# Patient Record
Sex: Female | Born: 1975 | Race: White | Hispanic: No | Marital: Single | Smoking: Former smoker
Health system: Southern US, Community
[De-identification: ages and names within clinical notes are randomized; demographics above are authoritative.]

## PROBLEM LIST (undated history)

## (undated) DIAGNOSIS — F419 Anxiety disorder, unspecified: Secondary | ICD-10-CM

## (undated) DIAGNOSIS — R569 Unspecified convulsions: Secondary | ICD-10-CM

## (undated) HISTORY — PX: THYROID SURGERY: SHX805

---

## 2009-10-03 ENCOUNTER — Ambulatory Visit: Payer: Self-pay | Admitting: Diagnostic Radiology

## 2009-10-03 ENCOUNTER — Emergency Department (HOSPITAL_BASED_OUTPATIENT_CLINIC_OR_DEPARTMENT_OTHER): Admission: EM | Admit: 2009-10-03 | Discharge: 2009-10-03 | Payer: Self-pay | Admitting: Emergency Medicine

## 2009-10-08 ENCOUNTER — Ambulatory Visit: Payer: Self-pay | Admitting: Occupational Medicine

## 2009-10-17 ENCOUNTER — Emergency Department (HOSPITAL_BASED_OUTPATIENT_CLINIC_OR_DEPARTMENT_OTHER): Admission: EM | Admit: 2009-10-17 | Discharge: 2009-10-18 | Payer: Self-pay | Admitting: Emergency Medicine

## 2010-02-15 ENCOUNTER — Emergency Department (HOSPITAL_COMMUNITY): Admission: EM | Admit: 2010-02-15 | Discharge: 2010-02-15 | Payer: Self-pay | Admitting: Emergency Medicine

## 2010-10-15 NOTE — Assessment & Plan Note (Signed)
Summary: Fever, cough-green x 3dys rm 2   Vital Signs:  Patient Profile:   35 Years Old Female CC:      Cold & URI symptoms Height:     67 inches Weight:      126 pounds O2 Sat:      100 % O2 treatment:    Room Air Temp:     99.7 degrees F oral Pulse rate:   86 / minute Pulse rhythm:   regular Resp:     16 per minute BP sitting:   124 / 81  (right arm) Cuff size:   regular  Vitals Entered By: Areta Haber CMA (October 08, 2009 6:46 PM)                  Current Allergies: ! DARVOCET    History of Present Illness Chief Complaint: Cold & URI symptoms History of Present Illness: Presents with complaints of dry cough, sinus congestion and sore throat for 5 days.   Low grade fever.   Went to the ER several days ago.  Strep test was negative.  She has not been on any antibiotics.  Continues to complain of sinus pressure and congestion as well as fever and night sweats.  Cough at night is very bothersome.    Current Problems: UPPER RESPIRATORY INFECTION, ACUTE (ICD-465.9)   Current Meds AZITHROMYCIN 250 MG  TABS (AZITHROMYCIN) 2 by  mouth today and then 1 daily for 4 days CHERATUSSIN AC 100-10 MG/5ML SYRP (GUAIFENESIN-CODEINE) 5ml every 12 hours for cough  REVIEW OF SYSTEMS Constitutional Symptoms       Complains of fever and chills.     Denies night sweats, weight loss, weight gain, and fatigue.  Eyes       Denies change in vision, eye pain, eye discharge, glasses, contact lenses, and eye surgery. Ear/Nose/Throat/Mouth       Complains of frequent runny nose.      Denies hearing loss/aids, change in hearing, ear pain, ear discharge, dizziness, frequent nose bleeds, sinus problems, sore throat, hoarseness, and tooth pain or bleeding.  Respiratory       Complains of productive cough.      Denies dry cough, wheezing, shortness of breath, asthma, bronchitis, and emphysema/COPD.  Cardiovascular       Denies murmurs, chest pain, and tires easily with exhertion.     Gastrointestinal       Denies stomach pain, nausea/vomiting, diarrhea, constipation, blood in bowel movements, and indigestion. Genitourniary       Denies painful urination, kidney stones, and loss of urinary control. Neurological       Denies paralysis, seizures, and fainting/blackouts. Musculoskeletal       Denies muscle pain, joint pain, joint stiffness, decreased range of motion, redness, swelling, muscle weakness, and gout.  Skin       Denies bruising, unusual mles/lumps or sores, and hair/skin or nail changes.  Psych       Denies mood changes, temper/anger issues, anxiety/stress, speech problems, depression, and sleep problems. Other Comments: green x 3 dys.Pt was seen at [facility] ED Dx viral infection. Pt  does not have PCP.   Past History:  Past Medical History: Mitral Valve prolapse  Past Surgical History: Denies surgical history  Social History: Single Current Smoker - 1 pack a week Alcohol use-yes - 2 a week Drug use-no Regular exercise-no Smoking Status:  current Drug Use:  no Does Patient Exercise:  no Physical Exam General appearance: well developed, well nourished, no acute distress Ears:  normal, no lesions or deformities Nasal: swollen red turbinates with congestion Oral/Pharynx: tongue normal, posterior pharynx without erythema or exudate Neck: neck supple,  trachea midline, no masses Chest/Lungs: no rales, wheezes, or rhonchi bilateral, breath sounds equal without effort Heart: regular rate and  rhythm, no murmur Assessment New Problems: UPPER RESPIRATORY INFECTION, ACUTE (ICD-465.9)   Plan New Medications/Changes: CHERATUSSIN AC 100-10 MG/5ML SYRP (GUAIFENESIN-CODEINE) 5ml every 12 hours for cough  #157ml x 0, 10/08/2009, Kathrine Haddock MD AZITHROMYCIN 250 MG  TABS (AZITHROMYCIN) 2 by  mouth today and then 1 daily for 4 days  #6 x 0, 10/08/2009, Kathrine Haddock MD  New Orders: New Patient Level III [phone]  The patient and/or caregiver has been  counseled thoroughly with regard to medications prescribed including dosage, schedule, interactions, rationale for use, and possible side effects and they verbalize understanding.  Diagnoses and expected course of recovery discussed and will return if not improved as expected or if the condition worsens. Patient and/or caregiver verbalized understanding.  Prescriptions: CHERATUSSIN AC 100-10 MG/5ML SYRP (GUAIFENESIN-CODEINE) 5ml every 12 hours for cough  #147ml x 0   Entered and Authorized by:   Kathrine Haddock MD   Signed by:   Kathrine Haddock MD on 10/08/2009   Method used:   Print then Give to Patient   RxID:   [id number] AZITHROMYCIN 250 MG  TABS (AZITHROMYCIN) 2 by  mouth today and then 1 daily for 4 days  #6 x 0   Entered and Authorized by:   Kathrine Haddock MD   Signed by:   Kathrine Haddock MD on 10/08/2009   Method used:   Print then Give to Patient   RxID:   [id number]   Patient Instructions: 1)  Take your antibiotic as prescribed until ALL of it is gone, but stop if you develop a rash or swelling and contact our office as soon as possible. 2)  Acute bronchitis symptoms for less than 10 days are not helped by antibiotics. take over the counter cough medications. call if no improvment in  5-7 days, sooner if increasing cough, fever, or new symptoms( shortness of breath, chest pain).

## 2010-12-02 LAB — RAPID STREP SCREEN (MED CTR MEBANE ONLY): Streptococcus, Group A Screen (Direct): NEGATIVE

## 2010-12-05 LAB — URINALYSIS, ROUTINE W REFLEX MICROSCOPIC
Glucose, UA: NEGATIVE mg/dL
Urobilinogen, UA: 0.2 mg/dL (ref 0.0–1.0)
pH: 5.5 (ref 5.0–8.0)

## 2011-01-03 ENCOUNTER — Emergency Department (HOSPITAL_BASED_OUTPATIENT_CLINIC_OR_DEPARTMENT_OTHER)
Admission: EM | Admit: 2011-01-03 | Discharge: 2011-01-03 | Disposition: A | Discharge status: Home / Self Care | Payer: Medicaid Other | Attending: Emergency Medicine | Admitting: Emergency Medicine

## 2011-01-03 DIAGNOSIS — F17200 Nicotine dependence, unspecified, uncomplicated: Secondary | ICD-10-CM | POA: Insufficient documentation

## 2011-01-03 DIAGNOSIS — R002 Palpitations: Secondary | ICD-10-CM | POA: Insufficient documentation

## 2011-01-03 LAB — COMPREHENSIVE METABOLIC PANEL
BUN: 12 mg/dL (ref 6–23)
Chloride: 106 mEq/L (ref 96–112)
GFR calc non Af Amer: >60 mL/min (ref >60)
Glucose, Bld: 74 mg/dL (ref 70–99)
Potassium: 4 mEq/L (ref 3.5–5.1)
Total Bilirubin: 0.6 mg/dL (ref 0.3–1.2)

## 2011-01-03 LAB — DIFFERENTIAL
Basophils Absolute: 0 K/uL (ref 0.0–0.1)
Basophils Relative: 1 % (ref 0–1)
Eosinophils Absolute: 0.1 K/uL (ref 0.0–0.7)
Eosinophils Relative: 1 % (ref 0–5)
Lymphocytes Relative: 30 % (ref 12–46)
Lymphs Abs: 2.4 K/uL (ref 0.7–4.0)
Monocytes Absolute: 0.7 K/uL (ref 0.1–1.0)
Monocytes Relative: 8 % (ref 3–12)
Neutro Abs: 5 K/uL (ref 1.7–7.7)
Neutrophils Relative %: 61 % (ref 43–77)

## 2011-01-03 LAB — CBC
HCT: 36.6 % (ref 36.0–46.0)
MCH: 29.2 pg (ref 26.0–34.0)
MCHC: 33.9 g/dL (ref 30.0–36.0)
MCV: 86.1 fL (ref 78.0–100.0)
Platelets: 310 10*3/uL (ref 150–400)
RDW: 13 % (ref 11.5–15.5)

## 2011-01-03 LAB — POCT TOXICOLOGY PANEL

## 2011-02-10 ENCOUNTER — Other Ambulatory Visit (HOSPITAL_COMMUNITY)
Admission: RE | Admit: 2011-02-10 | Discharge: 2011-02-10 | Disposition: A | Discharge status: Home / Self Care | Payer: Medicaid Other | Source: Ambulatory Visit | Attending: Obstetrics and Gynecology | Admitting: Obstetrics and Gynecology

## 2011-02-10 ENCOUNTER — Other Ambulatory Visit: Payer: Self-pay | Admitting: Obstetrics and Gynecology

## 2011-02-10 ENCOUNTER — Emergency Department (HOSPITAL_BASED_OUTPATIENT_CLINIC_OR_DEPARTMENT_OTHER)
Admission: EM | Admit: 2011-02-10 | Discharge: 2011-02-10 | Disposition: A | Discharge status: Home / Self Care | Payer: Medicaid Other | Attending: Emergency Medicine | Admitting: Emergency Medicine

## 2011-02-10 DIAGNOSIS — F17200 Nicotine dependence, unspecified, uncomplicated: Secondary | ICD-10-CM | POA: Insufficient documentation

## 2011-02-10 DIAGNOSIS — E876 Hypokalemia: Secondary | ICD-10-CM | POA: Insufficient documentation

## 2011-02-10 DIAGNOSIS — R002 Palpitations: Secondary | ICD-10-CM | POA: Insufficient documentation

## 2011-02-10 DIAGNOSIS — Z01419 Encounter for gynecological examination (general) (routine) without abnormal findings: Secondary | ICD-10-CM | POA: Insufficient documentation

## 2011-02-10 DIAGNOSIS — R079 Chest pain, unspecified: Secondary | ICD-10-CM | POA: Insufficient documentation

## 2011-02-10 LAB — CK TOTAL AND CKMB (NOT AT ARMC)
Relative Index: 1.6 (ref 0.0–2.5)
Total CK: 147 U/L (ref 7–177)

## 2011-02-10 LAB — BASIC METABOLIC PANEL
BUN: 13 mg/dL (ref 6–23)
GFR calc Af Amer: >60 mL/min (ref >60)
GFR calc non Af Amer: >60 mL/min (ref >60)
Glucose, Bld: 89 mg/dL (ref 70–99)

## 2011-02-10 LAB — TROPONIN I: Troponin I: <0.3 ng/mL (ref <0.30)

## 2011-02-10 LAB — TSH: TSH: 3.019 u[IU]/mL (ref 0.350–4.500)

## 2011-03-20 ENCOUNTER — Ambulatory Visit: Payer: Medicaid Other | Admitting: Cardiovascular Disease

## 2011-04-14 ENCOUNTER — Emergency Department (HOSPITAL_BASED_OUTPATIENT_CLINIC_OR_DEPARTMENT_OTHER)
Admission: EM | Admit: 2011-04-14 | Discharge: 2011-04-15 | Disposition: A | Discharge status: Home / Self Care | Payer: Medicaid Other | Attending: Emergency Medicine | Admitting: Emergency Medicine

## 2011-04-14 ENCOUNTER — Encounter: Payer: Self-pay | Admitting: *Deleted

## 2011-04-14 DIAGNOSIS — J40 Bronchitis, not specified as acute or chronic: Secondary | ICD-10-CM

## 2011-04-14 DIAGNOSIS — Y9289 Other specified places as the place of occurrence of the external cause: Secondary | ICD-10-CM | POA: Insufficient documentation

## 2011-04-14 DIAGNOSIS — R51 Headache: Secondary | ICD-10-CM | POA: Insufficient documentation
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
## 2011-04-15 MED ORDER — HYDROCODONE-ACETAMINOPHEN 5-325 MG PO TABS
1.0000 | ORAL_TABLET | Freq: Once | ORAL | Status: AC
  Administered 2011-04-15: 1 via ORAL
  Filled 2011-04-15: qty 1

## 2011-04-15 MED ORDER — ALBUTEROL SULFATE HFA 108 (90 BASE) MCG/ACT IN AERS
2.0000 | INHALATION_SPRAY | RESPIRATORY_TRACT | Status: DC | PRN
  Administered 2011-04-15: 2 via RESPIRATORY_TRACT
  Filled 2011-04-15: qty 6.7

## 2011-04-15 MED ORDER — HYDROCODONE-ACETAMINOPHEN 5-325 MG PO TABS: 1.0000 | ORAL_TABLET | Freq: Four times a day (QID) | ORAL | Status: AC | PRN
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
## 2011-04-15 NOTE — Procedures (Signed)
Instructed pt on the proper use of administering albuteral mdi via aerochamber pt tolerated well 

## 2011-04-24 ENCOUNTER — Encounter (HOSPITAL_BASED_OUTPATIENT_CLINIC_OR_DEPARTMENT_OTHER): Payer: Self-pay | Admitting: *Deleted

## 2011-04-24 ENCOUNTER — Emergency Department (HOSPITAL_BASED_OUTPATIENT_CLINIC_OR_DEPARTMENT_OTHER)
Admission: EM | Admit: 2011-04-24 | Discharge: 2011-04-24 | Disposition: A | Discharge status: Home / Self Care | Payer: Medicaid Other | Attending: Emergency Medicine | Admitting: Emergency Medicine

## 2011-04-24 DIAGNOSIS — R05 Cough: Secondary | ICD-10-CM | POA: Insufficient documentation

## 2011-04-24 DIAGNOSIS — R059 Cough, unspecified: Secondary | ICD-10-CM | POA: Insufficient documentation

## 2011-04-24 DIAGNOSIS — J069 Acute upper respiratory infection, unspecified: Secondary | ICD-10-CM | POA: Insufficient documentation

## 2011-04-24 HISTORY — DX: Unspecified convulsions: R56.9

## 2011-04-24 MED ORDER — GUAIFENESIN-CODEINE 100-10 MG/5ML PO SYRP: 5.0000 mL | ORAL_SOLUTION | Freq: Three times a day (TID) | ORAL | Status: AC | PRN

## 2011-04-24 MED ORDER — AZITHROMYCIN 250 MG PO TABS: ORAL_TABLET | ORAL | Status: DC

## 2011-04-24 NOTE — ED Notes (Signed)
Pt c/o cough, chills, body aches and fatigue constant for 2 days but off and on for 2 weeks. Pt also sts she has been exposed recently to walking pneumonia.

## 2011-04-24 NOTE — ED Provider Notes (Signed)
History     CSN: [id number] Arrival date & time: 04/24/2011  6:09 AM  Chief Complaint  Patient presents with  . Cough   Patient is a 35 y.o. female presenting with cough. The history is provided by the patient.  Cough The current episode started more than 1 week ago. The cough is non-productive. There has been no fever. Associated symptoms include chills and sore throat. Pertinent negatives include no chest pain, no headaches and no shortness of breath.  patient has had a cough over the last 2 weeks. She has felt bad for that time. seh staes that she has hurt all over. Her nose was running before. It started with a sore throat, but that has resolved. She has fatigue. She states that she was exposed to walking pneumonia. She states that she feels like she has the flu, but she doesn't have the flu. No NVD. No dysuria. No rash.   Past Medical History  Diagnosis Date  . Seizures     History reviewed. No pertinent past surgical history.  No family history on file.  History  Substance Use Topics  . Smoking status: Current Everyday Smoker  . Smokeless tobacco: Not on file  . Alcohol Use: No    OB History    Grav Para Term Preterm Abortions TAB SAB Ect Mult Living                  Review of Systems  Constitutional: Positive for chills, activity change and fatigue. Negative for fever.  HENT: Positive for congestion and sore throat. Negative for mouth sores, neck pain and neck stiffness.   Respiratory: Positive for cough. Negative for shortness of breath.   Cardiovascular: Negative for chest pain.  Gastrointestinal: Negative for abdominal pain.  Genitourinary: Negative for dysuria.  Musculoskeletal: Positive for arthralgias. Negative for back pain.  Neurological: Negative for headaches.  Psychiatric/Behavioral: Negative for confusion.    Physical Exam  BP 108/61  Pulse 92  Temp(Src) 99.2 F (37.3 C) (Oral)  Resp 18  Ht 5\' 7"  (1.702 m)  Wt 125 lb (56.7 kg)  BMI 19.58 kg/m2   SpO2 100%  LMP 04/23/2011  Physical Exam  Nursing note and vitals reviewed. Constitutional: She is oriented to person, place, and time. She appears well-developed and well-nourished.  HENT:  Head: Normocephalic and atraumatic.  Eyes: EOM are normal. Pupils are equal, round, and reactive to light.  Neck: Normal range of motion.  Cardiovascular: Normal rate, regular rhythm and normal heart sounds.   No murmur heard. Pulmonary/Chest: Effort normal and breath sounds normal. No respiratory distress. She has no wheezes. She has no rales.  Abdominal: Soft. Bowel sounds are normal. She exhibits no distension. There is no rebound and no guarding.       Minimal lower abdominal tenderness without rebound or guarding.   Musculoskeletal: Normal range of motion.  Lymphadenopathy:    She has cervical adenopathy.  Neurological: She is alert and oriented to person, place, and time. No cranial nerve deficit.  Skin: Skin is warm and dry.  Psychiatric: Her speech is normal.    ED Course  Procedures  MDM URI symptoms for 2 weeks. Not improved, and reportedly has a pneumonia contact. Will treat with abx. Well appearing.       Juliet Rude. Rubin Payor, MD 04/24/11 [phone]

## 2011-11-08 ENCOUNTER — Other Ambulatory Visit: Payer: Self-pay

## 2011-11-08 ENCOUNTER — Encounter (HOSPITAL_COMMUNITY): Payer: Self-pay | Admitting: *Deleted

## 2011-11-08 ENCOUNTER — Emergency Department (HOSPITAL_COMMUNITY)
Admission: EM | Admit: 2011-11-08 | Discharge: 2011-11-09 | Disposition: A | Discharge status: Home / Self Care | Payer: Self-pay | Attending: Emergency Medicine | Admitting: Emergency Medicine

## 2011-11-08 DIAGNOSIS — F419 Anxiety disorder, unspecified: Secondary | ICD-10-CM

## 2011-11-08 DIAGNOSIS — F411 Generalized anxiety disorder: Secondary | ICD-10-CM | POA: Insufficient documentation

## 2011-11-08 DIAGNOSIS — R079 Chest pain, unspecified: Secondary | ICD-10-CM | POA: Insufficient documentation

## 2011-11-08 DIAGNOSIS — R569 Unspecified convulsions: Secondary | ICD-10-CM | POA: Insufficient documentation

## 2011-11-08 HISTORY — DX: Anxiety disorder, unspecified: F41.9

## 2011-11-08 LAB — CBC
Platelets: 276 10*3/uL (ref 150–400)
RBC: 4.11 MIL/uL (ref 3.87–5.11)
WBC: 7.2 10*3/uL (ref 4.0–10.5)

## 2011-11-08 LAB — BASIC METABOLIC PANEL
CO2: 26 mEq/L (ref 19–32)
Chloride: 102 mEq/L (ref 96–112)
Potassium: 3.6 mEq/L (ref 3.5–5.1)
Sodium: 139 mEq/L (ref 135–145)

## 2011-11-08 LAB — TROPONIN I: Troponin I: <0.3 ng/mL (ref <0.30)

## 2011-11-08 LAB — PRO B NATRIURETIC PEPTIDE: Pro B Natriuretic peptide (BNP): 69.2 pg/mL (ref 0–125)

## 2011-11-08 MED ORDER — NITROGLYCERIN 0.4 MG SL SUBL: 0.4000 mg | SUBLINGUAL_TABLET | SUBLINGUAL | Status: DC | PRN

## 2011-11-08 MED ORDER — ASPIRIN 325 MG PO TABS
325.0000 mg | ORAL_TABLET | ORAL | Status: AC
  Administered 2011-11-08: 325 mg via ORAL
  Filled 2011-11-08: qty 1

## 2011-11-08 NOTE — ED Notes (Signed)
Pt had an episode of CP that was in her mid chest and was preceded by a brief episode of palpitations with lightheadedness and sob.    Pt denies any cp at this time but has bilateral hand numbness and tingling.  Pt states that her dad died of cardiac arrest at 96

## 2011-11-08 NOTE — ED Notes (Signed)
Pt states that her CP lasted for a few seconds and felt like her heart fluttering. Pt states that she was sick a couple of days ago and she tried to do errands all day today even though she did not feel completely better. Pt states that her chest felt like pressure and then went away. Pt also states that her stomach hurt and that she felt slightly nauseated. Pt does admit to eating [employer] thsi afternoon

## 2011-11-09 ENCOUNTER — Emergency Department (HOSPITAL_COMMUNITY): Payer: Self-pay

## 2011-11-09 LAB — TROPONIN I: Troponin I: <0.3 ng/mL (ref <0.30)

## 2011-11-09 NOTE — ED Provider Notes (Signed)
History     CSN: [id number]  Arrival date & time 11/08/11  2253   First MD Initiated Contact with Patient 11/08/11 2335      Chief Complaint  Patient presents with  . Chest Pain    (Consider location/radiation/quality/duration/timing/severity/associated sxs/prior treatment) Patient is a 36 y.o. female presenting with chest pain. The history is provided by the patient. No language interpreter was used.  Chest Pain The chest pain began yesterday. Chest pain occurs frequently. The chest pain is unchanged. The pain is associated with breathing. At its most intense, the pain is at 9/10. The pain is currently at 0/10. The quality of the pain is described as sharp. The pain does not radiate. Exacerbated by: nothing. Primary symptoms include palpitations. Pertinent negatives for primary symptoms include no fever, no fatigue, no syncope, no shortness of breath, no cough, no wheezing, no abdominal pain, no nausea, no vomiting, no dizziness and no altered mental status. Primary symptoms comment: anxiety  The palpitations did not occur with dizziness or shortness of breath.  She tried nothing for the symptoms. Risk factors include smoking/tobacco exposure.  Her past medical history is significant for anxiety/panic attacks.  Pertinent negatives for past medical history include no aneurysm.  Her family medical history is significant for CAD in family.  Procedure history is negative for cardiac catheterization.     Past Medical History  Diagnosis Date  . Seizures   . Anxiety     History reviewed. No pertinent past surgical history.  No family history on file.  History  Substance Use Topics  . Smoking status: Current Everyday Smoker  . Smokeless tobacco: Not on file  . Alcohol Use: No    OB History    Grav Para Term Preterm Abortions TAB SAB Ect Mult Living                  Review of Systems  Constitutional: Negative for fever and fatigue.  HENT: Negative.   Eyes: Negative.     Respiratory: Negative for cough, shortness of breath and wheezing.   Cardiovascular: Positive for chest pain and palpitations. Negative for syncope.  Gastrointestinal: Negative.  Negative for nausea, vomiting and abdominal pain.  Genitourinary: Negative.   Musculoskeletal: Negative.   Neurological: Negative for dizziness.  Hematological: Negative.   Psychiatric/Behavioral: Negative.  Negative for altered mental status.    Allergies  Propoxyphene n-acetaminophen  Home Medications  No current outpatient prescriptions on file.  BP 138/73  Temp(Src) 98.5 F (36.9 C) (Oral)  Resp 20  Physical Exam  Constitutional: She is oriented to person, place, and time. She appears well-developed and well-nourished. No distress.  HENT:  Head: Normocephalic and atraumatic.  Mouth/Throat: Oropharynx is clear and moist.  Eyes: Conjunctivae are normal. Pupils are equal, round, and reactive to light.  Neck: Normal range of motion. Neck supple.  Cardiovascular: Normal rate and regular rhythm.   Pulmonary/Chest: Effort normal and breath sounds normal. She has no wheezes. She has no rales.  Abdominal: Soft. Bowel sounds are normal. There is no tenderness. There is no rebound.  Musculoskeletal: Normal range of motion. She exhibits no edema.  Neurological: She is alert and oriented to person, place, and time. She has normal reflexes.  Skin: Skin is warm and dry. She is not diaphoretic.  Psychiatric: Her mood appears anxious.    ED Course  Procedures (including critical care time)  Labs Reviewed  CBC - Abnormal; Notable for the following:    HCT 35.4 (*)  All other components within normal limits  BASIC METABOLIC PANEL  PRO B NATRIURETIC PEPTIDE  TROPONIN I  POCT PREGNANCY, URINE  TROPONIN I   Dg Chest 2 View  11/09/2011  *RADIOLOGY REPORT*  Clinical Data: Substernal chest pain.  CHEST - 2 VIEW  Comparison: 10/03/2009  Findings: Heart and mediastinal contours are within normal limits. No  focal opacities or effusions.  No acute bony abnormality.  IMPRESSION: Normal study.  Original Report Authenticated By: Cyndie Chime, M.D.     1. Anxiety       MDM   Date: 11/09/2011  Rate: 73  Rhythm: normal sinus rhythm  QRS Axis: normal  Intervals: normal  ST/T Wave abnormalities: normal  Conduction Disutrbances:none  Narrative Interpretation: borderline right axis  Old EKG Reviewed: unchanged    Return for worsening chest pain or shortness of breath follow up with your family doctor    Kayah Hecker Smitty Cords, MD 11/09/11 [phone]

## 2011-11-09 NOTE — Discharge Instructions (Signed)

## 2018-10-05 ENCOUNTER — Other Ambulatory Visit: Payer: Self-pay

## 2018-10-05 ENCOUNTER — Emergency Department (HOSPITAL_BASED_OUTPATIENT_CLINIC_OR_DEPARTMENT_OTHER): Payer: Medicaid Other

## 2018-10-05 ENCOUNTER — Emergency Department (HOSPITAL_BASED_OUTPATIENT_CLINIC_OR_DEPARTMENT_OTHER)
Admission: EM | Admit: 2018-10-05 | Discharge: 2018-10-05 | Disposition: A | Discharge status: Home / Self Care | Payer: Medicaid Other | Attending: Emergency Medicine | Admitting: Emergency Medicine

## 2018-10-05 ENCOUNTER — Encounter (HOSPITAL_BASED_OUTPATIENT_CLINIC_OR_DEPARTMENT_OTHER): Payer: Self-pay | Admitting: Emergency Medicine

## 2018-10-05 DIAGNOSIS — J069 Acute upper respiratory infection, unspecified: Secondary | ICD-10-CM | POA: Diagnosis not present

## 2018-10-05 DIAGNOSIS — F17210 Nicotine dependence, cigarettes, uncomplicated: Secondary | ICD-10-CM | POA: Insufficient documentation

## 2018-10-05 DIAGNOSIS — Z79899 Other long term (current) drug therapy: Secondary | ICD-10-CM | POA: Diagnosis not present

## 2018-10-05 DIAGNOSIS — R05 Cough: Secondary | ICD-10-CM | POA: Diagnosis present

## 2018-10-05 LAB — URINALYSIS, ROUTINE W REFLEX MICROSCOPIC
Bilirubin Urine: NEGATIVE
GLUCOSE, UA: NEGATIVE mg/dL
Ketones, ur: 15 mg/dL — AB
LEUKOCYTES UA: NEGATIVE
Nitrite: NEGATIVE
PH: 5.5 (ref 5.0–8.0)
PROTEIN: NEGATIVE mg/dL
Specific Gravity, Urine: >1.03 — ABNORMAL HIGH (ref 1.005–1.030)

## 2018-10-05 LAB — URINALYSIS, MICROSCOPIC (REFLEX)

## 2018-10-05 MED ORDER — ACETAMINOPHEN 500 MG PO TABS
1000.0000 mg | ORAL_TABLET | Freq: Once | ORAL | Status: AC
Start: 2018-10-05 — End: 2018-10-05
  Administered 2018-10-05: 1000 mg via ORAL
  Filled 2018-10-05: qty 2

## 2018-10-05 NOTE — ED Triage Notes (Signed)
Pt states she has breathing a lot of cleaning chemicals.  Yesterday started with sore throat, cough.  No acute respiratory distress.  Denies flu shot.

## 2018-10-05 NOTE — ED Provider Notes (Signed)
[facility] EMERGENCY DEPARTMENT Provider Note   CSN: [id number] Arrival date & time: 10/05/18  1456     History   Chief Complaint Chief Complaint  Patient presents with  . URI    HPI Madison Pierce is a 43 y.o. female.  The history is provided by the patient.  URI  Presenting symptoms: congestion, cough and fever   Presenting symptoms: no ear pain and no sore throat   Severity:  Moderate Onset quality:  Gradual Duration:  2 days Timing:  Intermittent Progression:  Waxing and waning Chronicity:  New Relieved by:  OTC medications Worsened by:  Nothing Associated symptoms: myalgias   Associated symptoms: no arthralgias and no wheezing   Risk factors: no sick contacts     Past Medical History:  Diagnosis Date  . Anxiety   . Seizures (HCC)     There are no active problems to display for this patient.   No past surgical history on file.   OB History   No obstetric history on file.      Home Medications    Prior to Admission medications   Medication Sig Start Date End Date Taking? Authorizing Provider  levothyroxine (SYNTHROID, LEVOTHROID) 25 MCG tablet daily. 04/16/17  Yes [provider]  Cholecalciferol (VITAMIN D) 50 MCG (2000 UT) tablet Take by mouth.    [provider]    Family History No family history on file.  Social History Social History   Tobacco Use  . Smoking status: Current Every Day Smoker  . Smokeless tobacco: Never Used  Substance Use Topics  . Alcohol use: No  . Drug use: No     Allergies   Propoxyphene n-acetaminophen   Review of Systems Review of Systems  Constitutional: Positive for fever. Negative for chills.  HENT: Positive for congestion. Negative for ear pain and sore throat.   Eyes: Negative for pain and visual disturbance.  Respiratory: Positive for cough. Negative for shortness of breath and wheezing.   Cardiovascular: Negative for chest pain and palpitations.  Gastrointestinal:  Negative for abdominal pain and vomiting.  Genitourinary: Negative for dysuria and hematuria.  Musculoskeletal: Positive for myalgias. Negative for arthralgias and back pain.  Skin: Negative for color change and rash.  Neurological: Negative for seizures and syncope.  All other systems reviewed and are negative.    Physical Exam Updated Vital Signs BP (!) 117/95 (BP Location: Right Arm)   Pulse (!) 111   Temp 98.9 F (37.2 C) (Oral)   Resp 16   Ht 5\' 6"  (1.676 m)   Wt 65.3 kg   LMP 09/14/2018   SpO2 99%   BMI 23.24 kg/m   Physical Exam Vitals signs and nursing note reviewed.  Constitutional:      General: She is not in acute distress.    Appearance: She is well-developed.  HENT:     Head: Normocephalic and atraumatic.     Right Ear: Tympanic membrane normal.     Left Ear: Tympanic membrane normal.     Nose: Congestion present.     Mouth/Throat:     Pharynx: No oropharyngeal exudate or posterior oropharyngeal erythema.  Eyes:     Extraocular Movements: Extraocular movements intact.     Conjunctiva/sclera: Conjunctivae normal.     Pupils: Pupils are equal, round, and reactive to light.  Neck:     Musculoskeletal: Normal range of motion and neck supple.  Cardiovascular:     Rate and Rhythm: Normal rate and regular rhythm.  Pulses: Normal pulses.     Heart sounds: Normal heart sounds. No murmur.  Pulmonary:     Effort: Pulmonary effort is normal. No respiratory distress.     Breath sounds: Normal breath sounds.  Abdominal:     General: Abdomen is flat.     Palpations: Abdomen is soft.     Tenderness: There is no abdominal tenderness.  Skin:    General: Skin is warm and dry.     Capillary Refill: Capillary refill takes less than 2 seconds.  Neurological:     General: No focal deficit present.     Mental Status: She is alert.  Psychiatric:        Mood and Affect: Mood normal.      ED Treatments / Results  Labs (all labs ordered are listed, but only  abnormal results are displayed) Labs Reviewed  URINALYSIS, ROUTINE W REFLEX MICROSCOPIC - Abnormal; Notable for the following components:      Result Value   Specific Gravity, Urine >1.030 (*)    Hgb urine dipstick TRACE (*)    Ketones, ur 15 (*)    All other components within normal limits  URINALYSIS, MICROSCOPIC (REFLEX) - Abnormal; Notable for the following components:   Bacteria, UA RARE (*)    All other components within normal limits    EKG None  Radiology Dg Chest 2 View  Result Date: 10/05/2018 CLINICAL DATA:  Cough and fever for several days EXAM: CHEST - 2 VIEW COMPARISON:  11/09/2011 FINDINGS: The heart size and mediastinal contours are within normal limits. Both lungs are clear. The visualized skeletal structures are unremarkable. IMPRESSION: No active cardiopulmonary disease. Electronically Signed   By: Alcide Clever M.D.   On: 10/05/2018 15:34    Procedures Procedures (including critical care time)  Medications Ordered in ED Medications  acetaminophen (TYLENOL) tablet 1,000 mg (1,000 mg Oral Given 10/05/18 1527)     Initial Impression / Assessment and Plan / ED Course  I have reviewed the triage vital signs and the nursing notes.  Pertinent labs & imaging results that were available during my care of the patient were reviewed by me and considered in my medical decision making (see chart for details).     Madison Pierce is a 43 year old female with no significant medical history who presents to the ED with cough, fever.  Patient with overall unremarkable vitals.  No fever.  Patient with symptoms for the last 2 days.  Has had some pain with urination.  Cough but no sputum production.  No signs of ear or throat infection on exam.  No abdominal tenderness.  Patient overall well-hydrated on exam.  Overall well-appearing.  Chest x-ray showed no signs of pneumonia, pneumothorax, pleural effusion.  Urinalysis showed no sign of infection.  Some mild ketones.  Recommend  increased oral hydration at home.  Recommend continued use of Tylenol and Motrin at home.  Patient given Tylenol while in the ED.  Had improvement.  Was able to tolerate p.o. without any issues.  Given return precautions and recommend follow-up with primary care doctor.  Patient likely with viral process.  Understands return precautions.  This chart was dictated using voice recognition software.  Despite best efforts to proofread,  errors can occur which can change the documentation meaning.   Final Clinical Impressions(s) / ED Diagnoses   Final diagnoses:  Upper respiratory tract infection, unspecified type    ED Discharge Orders    None       Tammie Ellsworth, DO  10/05/18 1559  

## 2018-10-05 NOTE — Discharge Instructions (Addendum)
There was no urinary tract infection or pneumonia on your lab work today.  Follow-up with your primary care provider symptoms do not improve.  Continue Tylenol, Motrin, hydration at home.

## 2019-12-08 ENCOUNTER — Other Ambulatory Visit: Payer: Self-pay

## 2019-12-08 ENCOUNTER — Emergency Department (HOSPITAL_BASED_OUTPATIENT_CLINIC_OR_DEPARTMENT_OTHER): Payer: Medicaid Other

## 2019-12-08 ENCOUNTER — Encounter (HOSPITAL_BASED_OUTPATIENT_CLINIC_OR_DEPARTMENT_OTHER): Payer: Self-pay

## 2019-12-08 ENCOUNTER — Emergency Department (HOSPITAL_BASED_OUTPATIENT_CLINIC_OR_DEPARTMENT_OTHER)
Admission: EM | Admit: 2019-12-08 | Discharge: 2019-12-08 | Disposition: A | Discharge status: Home / Self Care | Payer: Medicaid Other | Attending: Emergency Medicine | Admitting: Emergency Medicine

## 2019-12-08 DIAGNOSIS — G40909 Epilepsy, unspecified, not intractable, without status epilepticus: Secondary | ICD-10-CM | POA: Insufficient documentation

## 2019-12-08 DIAGNOSIS — Y9301 Activity, walking, marching and hiking: Secondary | ICD-10-CM | POA: Diagnosis not present

## 2019-12-08 DIAGNOSIS — Z87891 Personal history of nicotine dependence: Secondary | ICD-10-CM | POA: Insufficient documentation

## 2019-12-08 DIAGNOSIS — Y929 Unspecified place or not applicable: Secondary | ICD-10-CM | POA: Diagnosis not present

## 2019-12-08 DIAGNOSIS — W19XXXA Unspecified fall, initial encounter: Secondary | ICD-10-CM

## 2019-12-08 DIAGNOSIS — S93401A Sprain of unspecified ligament of right ankle, initial encounter: Secondary | ICD-10-CM | POA: Insufficient documentation

## 2019-12-08 DIAGNOSIS — Y999 Unspecified external cause status: Secondary | ICD-10-CM | POA: Diagnosis not present

## 2019-12-08 DIAGNOSIS — Z79899 Other long term (current) drug therapy: Secondary | ICD-10-CM | POA: Insufficient documentation

## 2019-12-08 DIAGNOSIS — S99911A Unspecified injury of right ankle, initial encounter: Secondary | ICD-10-CM | POA: Diagnosis present

## 2019-12-08 DIAGNOSIS — Z886 Allergy status to analgesic agent status: Secondary | ICD-10-CM | POA: Diagnosis not present

## 2019-12-08 DIAGNOSIS — H5712 Ocular pain, left eye: Secondary | ICD-10-CM

## 2019-12-08 DIAGNOSIS — W1842XA Slipping, tripping and stumbling without falling due to stepping into hole or opening, initial encounter: Secondary | ICD-10-CM | POA: Diagnosis not present

## 2019-12-08 MED ORDER — FLUORESCEIN SODIUM 1 MG OP STRP
1.0000 | ORAL_STRIP | Freq: Once | OPHTHALMIC | Status: AC
  Administered 2019-12-08: 1 via OPHTHALMIC
  Filled 2019-12-08: qty 1

## 2019-12-08 MED ORDER — TETRACAINE HCL 0.5 % OP SOLN
2.0000 [drp] | Freq: Once | OPHTHALMIC | Status: AC
  Administered 2019-12-08: 18:00:00 2 [drp] via OPHTHALMIC
  Filled 2019-12-08: qty 4

## 2019-12-08 MED ORDER — ERYTHROMYCIN 5 MG/GM OP OINT: TOPICAL_OINTMENT | OPHTHALMIC | 0 refills | Status: AC

## 2019-12-08 NOTE — ED Provider Notes (Signed)
[facility] EMERGENCY DEPARTMENT Provider Note   CSN: [id number] Arrival date & time: 12/08/19  1718     History Chief Complaint  Patient presents with  . Fall  . Conjunctivitis    Madison Pierce is a 44 y.o. female with past medical history of anxiety and seizures presenting to emergency department today with multiple chief complaints of left eye redness and right ankle pain. Patient was walking in her neighborhood when she accidentally stepped in a pothole causing her right ankle to invert.  She had sudden onset of pain in her ankle.  Pain radiates down her foot.  She has minimal pain at rest but with any movement pain worsens.  She rates pain 6 out of 10 in severity.  She tried to cut herself in the fall and has scrapes to bilateral palms and left elbow.  She not take any medications for pain prior to arrival.  She can bear weight on her right ankle but states it is very painful when trying to do so.  Denies hitting her head or passing out.  Patient has left eye redness x3 days.  She does not wear contacts.  When she first noticed the redness she thought it was a popped blood vessel.  She does admit to mild photophobia and itching sensation in her left eye.  She has not tried any over-the-counter treatments for her eye pain or redness.  Patient does wear glasses as needed, not wearing them currently.  Denies any blurry vision.  She tried to make an appointment with her eye doctor but was unable to.  She denies fever, chills, congestion, sore throat, neck pain, back pain, rash, numbness, tingling, weakness.  History provided by patient with additional history obtained from chart review.     Past Medical History:  Diagnosis Date  . Anxiety   . Seizures (HCC)     There are no problems to display for this patient.   Past Surgical History:  Procedure Laterality Date  . THYROID SURGERY       OB History   No obstetric history on file.     No family history on  file.  Social History   Tobacco Use  . Smoking status: Former Games developer  . Smokeless tobacco: Never Used  Substance Use Topics  . Alcohol use: Yes    Comment: occ  . Drug use: No    Home Medications Prior to Admission medications   Medication Sig Start Date End Date Taking? Authorizing Provider  levothyroxine (SYNTHROID, LEVOTHROID) 25 MCG tablet daily. 04/16/17  Yes [provider]  Cholecalciferol (VITAMIN D) 50 MCG (2000 UT) tablet Take by mouth.    [provider]  erythromycin ophthalmic ointment Place a 1/2 inch ribbon of ointment into the lower eyelid. 12/08/19   Taralee Marcus, Caroleen Hamman, PA-C    Allergies    Propoxyphene n-acetaminophen  Review of Systems   Review of Systems All other systems are reviewed and are negative for acute change except as noted in the HPI.  Physical Exam Updated Vital Signs BP 129/75 (BP Location: Left Arm)   Pulse 80   Temp 98.3 F (36.8 C) (Oral)   Resp 18   Ht 5\' 6"  (1.676 m)   Wt 61.2 kg   LMP 11/27/2019   SpO2 100%   BMI 21.79 kg/m   Physical Exam Vitals and nursing note reviewed.  Constitutional:      Appearance: She is well-developed. She is not ill-appearing or toxic-appearing.  HENT:  Head: Normocephalic and atraumatic.     Nose: Nose normal.  Eyes:     General: Lids are normal. Lids are everted, no foreign bodies appreciated. No scleral icterus.       Right eye: No discharge.        Left eye: No discharge.     Extraocular Movements: Extraocular movements intact.     Conjunctiva/sclera:     Left eye: Left conjunctiva is injected. No chemosis or exudate.    Pupils: Pupils are equal, round, and reactive to light.     Comments: Tetracaine and Fluorescein applied to both eyes.  Evaluation by Sherral Hammers lamp revealed no evidence of corneal abrasion/fluorescein uptake.  No dendritic lesions.  Negative Seidel sign.   Neck:     Vascular: No JVD.  Cardiovascular:     Rate and Rhythm: Normal rate and regular rhythm.      Pulses: Normal pulses.     Heart sounds: Normal heart sounds.  Pulmonary:     Effort: Pulmonary effort is normal.     Breath sounds: Normal breath sounds.  Abdominal:     General: There is no distension.  Musculoskeletal:        General: Normal range of motion.     Cervical back: Normal range of motion.     Comments: Superficial abrasions to bilateral palms.  No bleeding noted.  No tenderness to palpation of thoracic or lumbar spinous processes.  No step-off or deformity.  Full range of motion back.  Pelvis is stable. Full range of motion of bilateral knees.  There is swelling and tenderness over the lateral malleolus of right ankle.No overt deformity. No tenderness over the medial aspect of the ankle. The fifth metatarsal is not tender. The ankle joint is intact without excessive opening on stressing. No break in skin. Good pedal pulse and cap refill of all toes. Wiggling toes without difficulty.     Skin:    General: Skin is warm and dry.  Neurological:     Mental Status: She is oriented to person, place, and time.     GCS: GCS eye subscore is 4. GCS verbal subscore is 5. GCS motor subscore is 6.     Comments: Fluent speech, no facial droop.  Psychiatric:        Behavior: Behavior normal.     ED Results / Procedures / Treatments   Labs (all labs ordered are listed, but only abnormal results are displayed) Labs Reviewed - No data to display  EKG None  Radiology DG Ankle Complete Right  Result Date: 12/08/2019 CLINICAL DATA:  RIGHT ankle pain post fall today EXAM: RIGHT ANKLE - COMPLETE 3+ VIEW COMPARISON:  None FINDINGS: Minimal soft tissue swelling. Osseous mineralization normal. Joint spaces preserved. No acute fracture, dislocation, or bone destruction. IMPRESSION: No acute osseous abnormalities. Electronically Signed   By: Lavonia Dana M.D.   On: 12/08/2019 19:08   DG Foot Complete Right  Result Date: 12/08/2019 CLINICAL DATA:  RIGHT foot pain post fall today,  specially laterally EXAM: RIGHT FOOT COMPLETE - 3+ VIEW COMPARISON:  None FINDINGS: Osseous mineralization normal. Joint spaces preserved. No fracture, dislocation, or bone destruction. IMPRESSION: Normal exam. Electronically Signed   By: Lavonia Dana M.D.   On: 12/08/2019 19:15    Procedures Procedures (including critical care time)  Medications Ordered in ED Medications  fluorescein ophthalmic strip 1 strip (1 strip Both Eyes Given by Other 12/08/19 1821)  tetracaine (PONTOCAINE) 0.5 % ophthalmic solution 2 drop (2 drops Both  Eyes Given by Other 12/08/19 1820)    ED Course  I have reviewed the triage vital signs and the nursing notes.  Pertinent labs & imaging results that were available during my care of the patient were reviewed by me and considered in my medical decision making (see chart for details).    MDM Rules/Calculators/A&P                      Patient seen and examined. Patient presents awake, alert, hemodynamically stable, afebrile, non toxic.  She is presenting to the ED with complaints of pain to the right ankle s/p injury mechanical fall. Exam without obvious deformity or open wounds. ROM intact. Tender to palpation over lateral malleolus of right ankle. NVI distally. Xray negative for fracture/dislocation.Therapeutic splint and crutches provided for ankle sprain. PRICE and motrin recommended  She also has redness to her left eye with mild photophobia and itching sensation.  Exam revealed no evidence of corneal abrasion, no fluorescein uptake, no dendritic lesions.  Will give prescription for erythromycin ointment cover for possible conjunctivitis.  Pupil is normal in appearance, PERRL.  Visual acuity is normal.  Recommend she follow-up with her eye doctor for symptom recheck. I discussed results, treatment plan, need for follow-up, and return precautions with the patient. Provided opportunity for questions, patient confirmed understanding and are in agreement with plan.     Portions of this note were generated with Scientist, clinical (histocompatibility and immunogenetics). Dictation errors may occur despite best attempts at proofreading.    Final Clinical Impression(s) / ED Diagnoses Final diagnoses:  Fall, initial encounter  Sprain of right ankle, unspecified ligament, initial encounter  Pain of left eye    Rx / DC Orders ED Discharge Orders         Ordered    erythromycin ophthalmic ointment     12/08/19 1952           Sherene Sires, PA-C 12/08/19 1959    Virgina Norfolk, DO 12/08/19 2327

## 2019-12-08 NOTE — ED Notes (Signed)
Ice applied to right ankle.

## 2019-12-08 NOTE — Discharge Instructions (Addendum)
-  Prescription printed for erythromycin ointment for your eye. If symptoms do no improve in 1 week you can use this medicine   Your caregiver has diagnosed you as suffering from an ankle sprain. Ankle sprain occurs when the ligaments that hold the ankle joint together are stretched or torn. It may take 4 to 6 weeks to heal.  -Follow-up: Call orthopedic follow up today today or tomorrow to schedule followup appointment for recheck of ongoing ankle pain that can be canceled with a 24-48 hour notice if complete resolution of pain.  For Activity: Use crutches with non-weight bearing for the first few days. Then, you may walk on your ankle as the pain allows, or as instructed. Start gradually with weight bearing on the affected ankle. Once you can walk pain free, then try jogging. When you can run forwards, then you can try moving side-to-side. If you cannot walk without crutches in one week, you need a re-check. SEEK IMMEDIATE MEDICAL ATTENTION IF: your toes are numb or tingling, appear gray or blue, or you have severe pain (also elevate leg and loosen splint).

## 2019-12-08 NOTE — ED Triage Notes (Signed)
Pt states she tripped/fell ~45 min PTA-pain to right ankle, left knee, left elbow and both hands-pt also wants to be seen for redness to left eye x 3 days-NAD-to triage in w/c

## 2019-12-08 NOTE — ED Notes (Signed)
Pt states vision intact.  Wears corrective glasses but does not have them with her today.

## 2021-02-17 IMAGING — DX DG ANKLE COMPLETE 3+V*R*
3 series · 3 of 3 positions shown · non-contrast
Comparison: None

CLINICAL DATA: RIGHT ankle pain post fall today

EXAM:
RIGHT ANKLE - COMPLETE 3+ VIEW

[ankle ap]
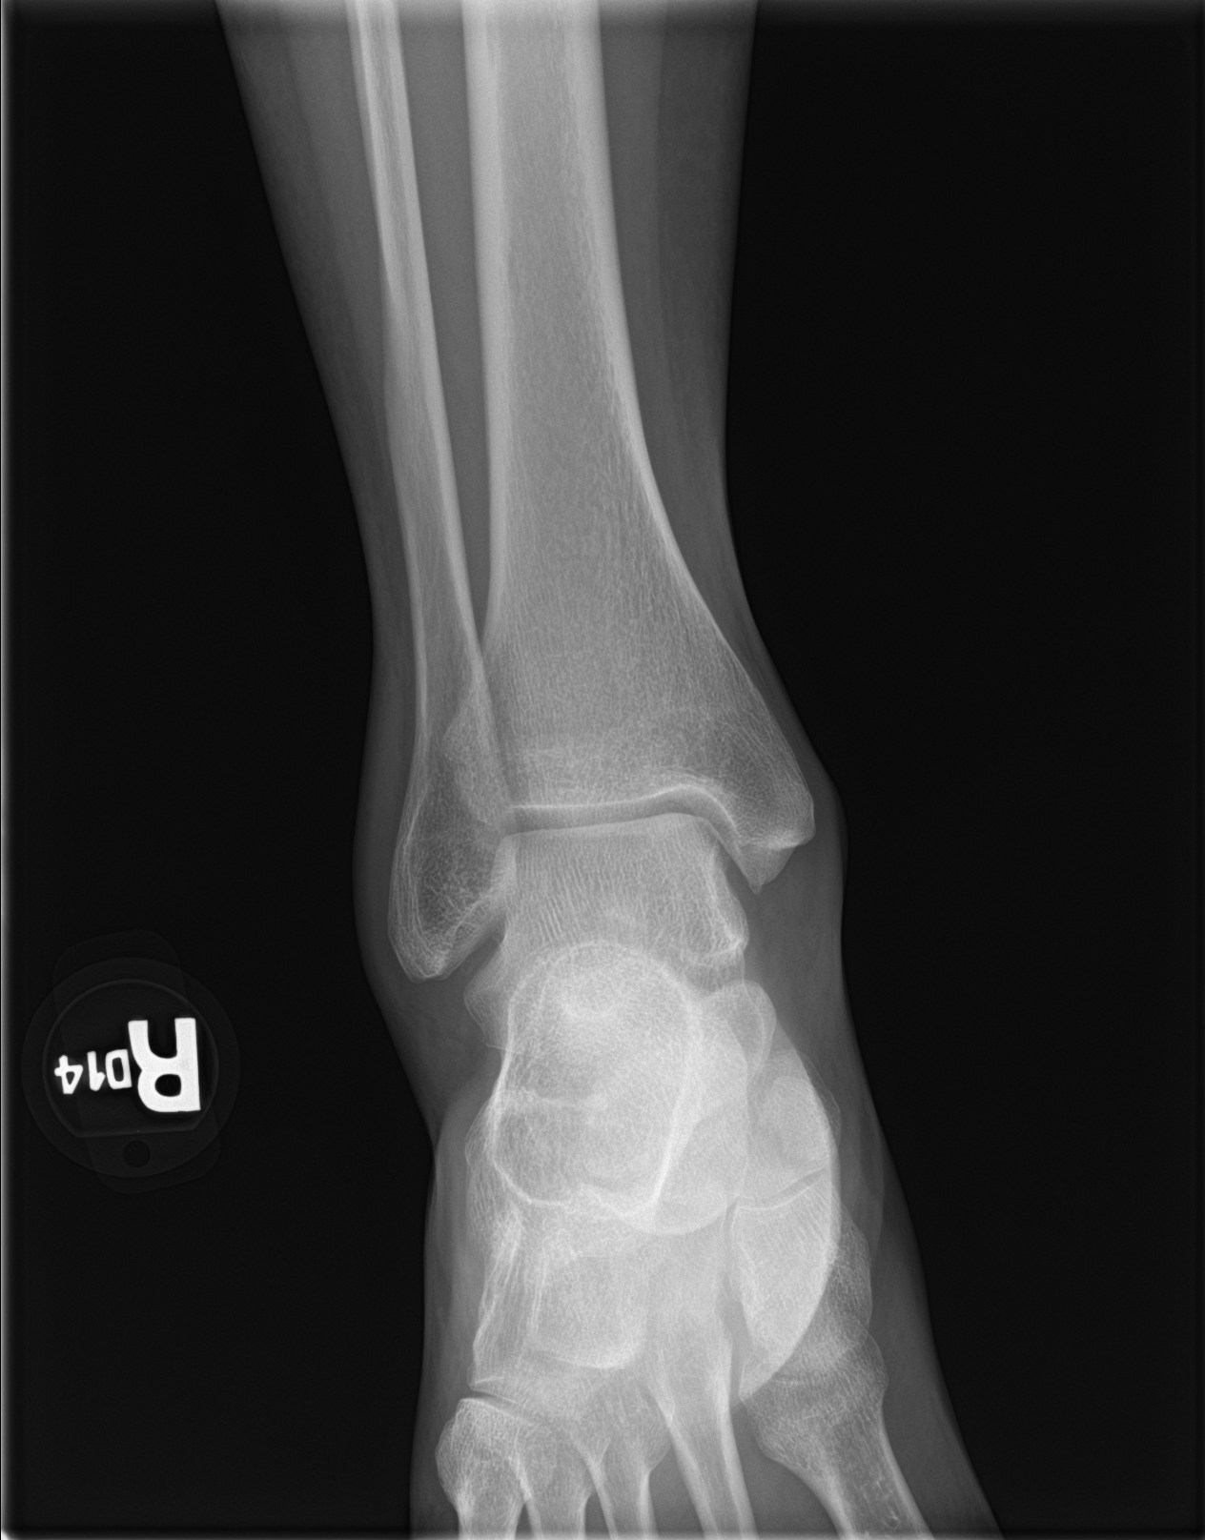

[ankle lat]
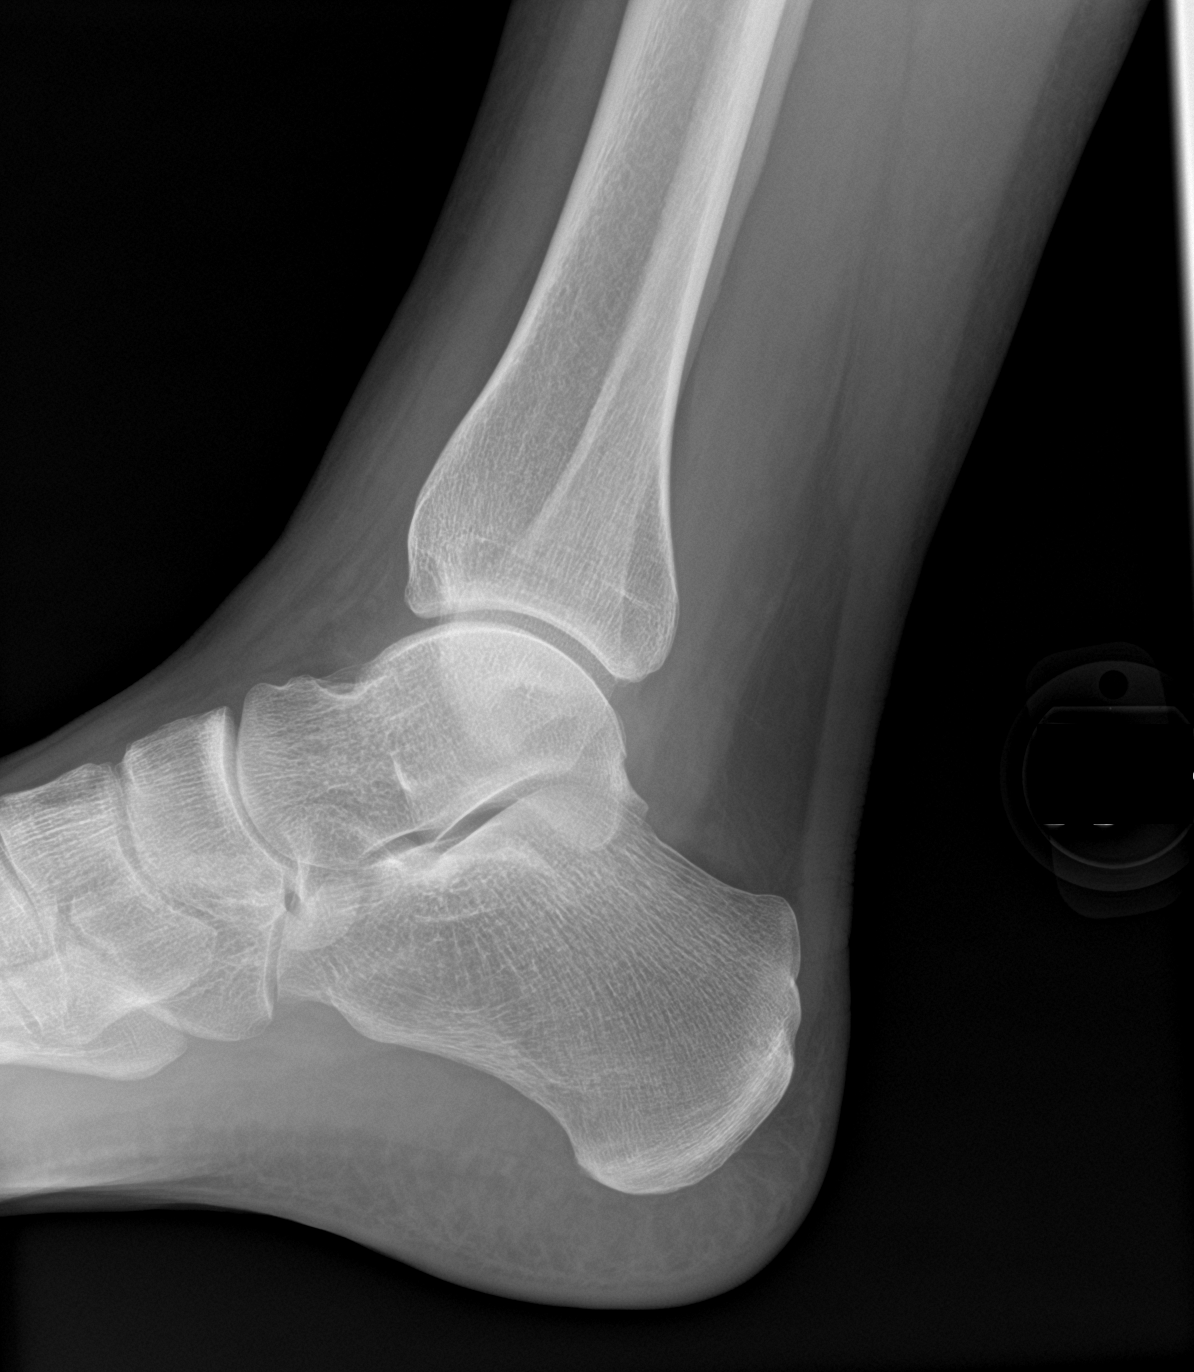

[ankle obl]
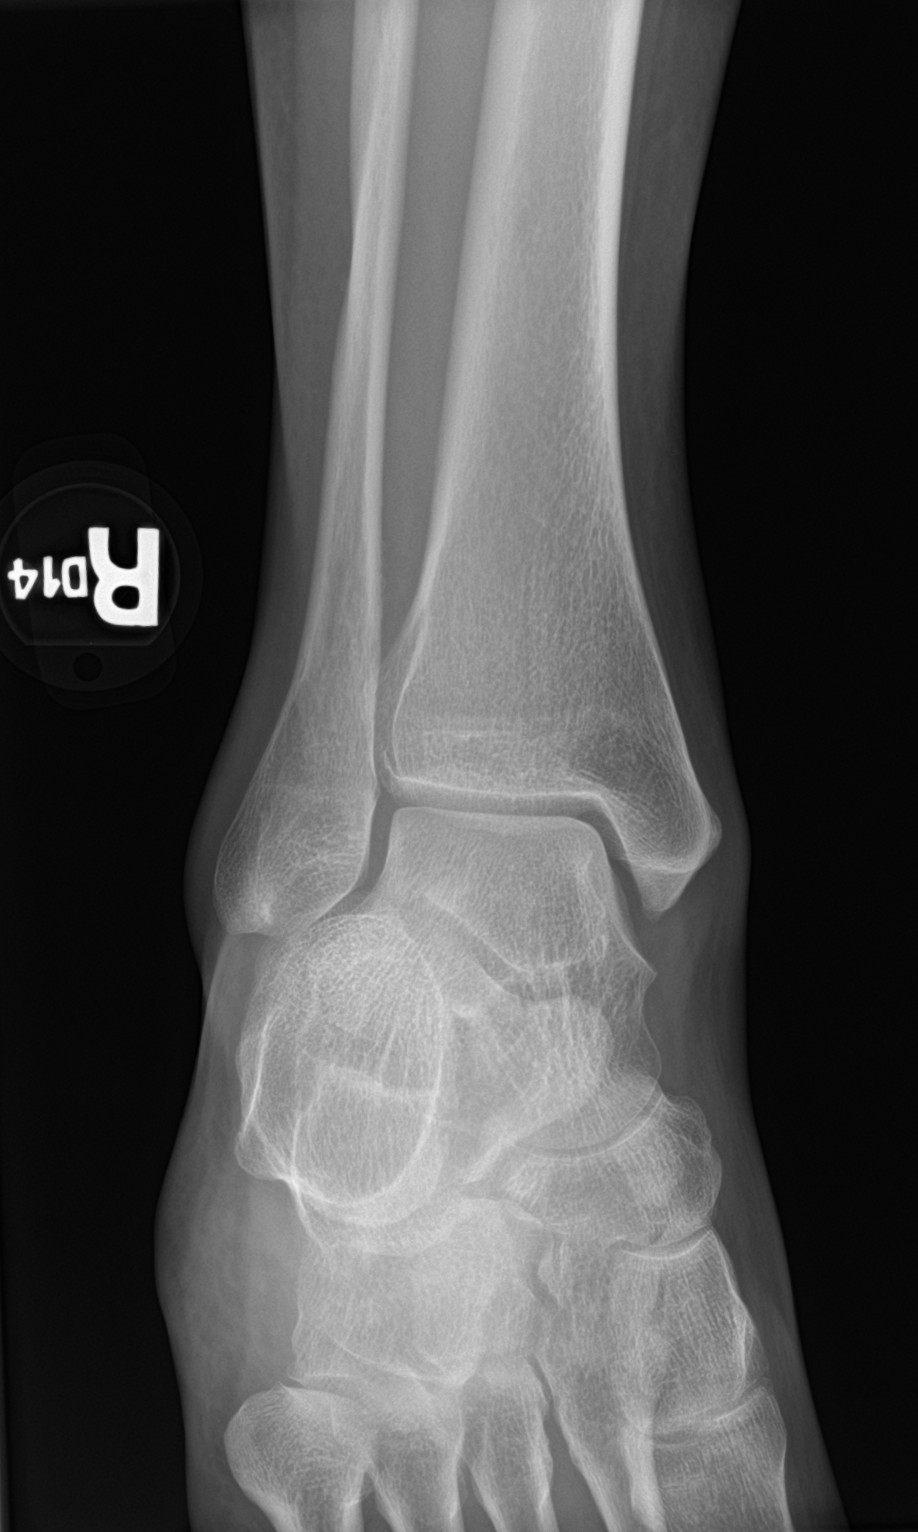

[3 of 3 positions shown; findings below may reference images not displayed]

FINDINGS: Minimal soft tissue swelling.

Osseous mineralization normal.

Joint spaces preserved.

No acute fracture, dislocation, or bone destruction.
IMPRESSION: No acute osseous abnormalities.

## 2021-02-17 IMAGING — DX DG FOOT COMPLETE 3+V*R*
3 series · 3 of 3 positions shown · non-contrast
Comparison: None

CLINICAL DATA: RIGHT foot pain post fall today, specially laterally

EXAM:
RIGHT FOOT COMPLETE - 3+ VIEW

[foot ap]
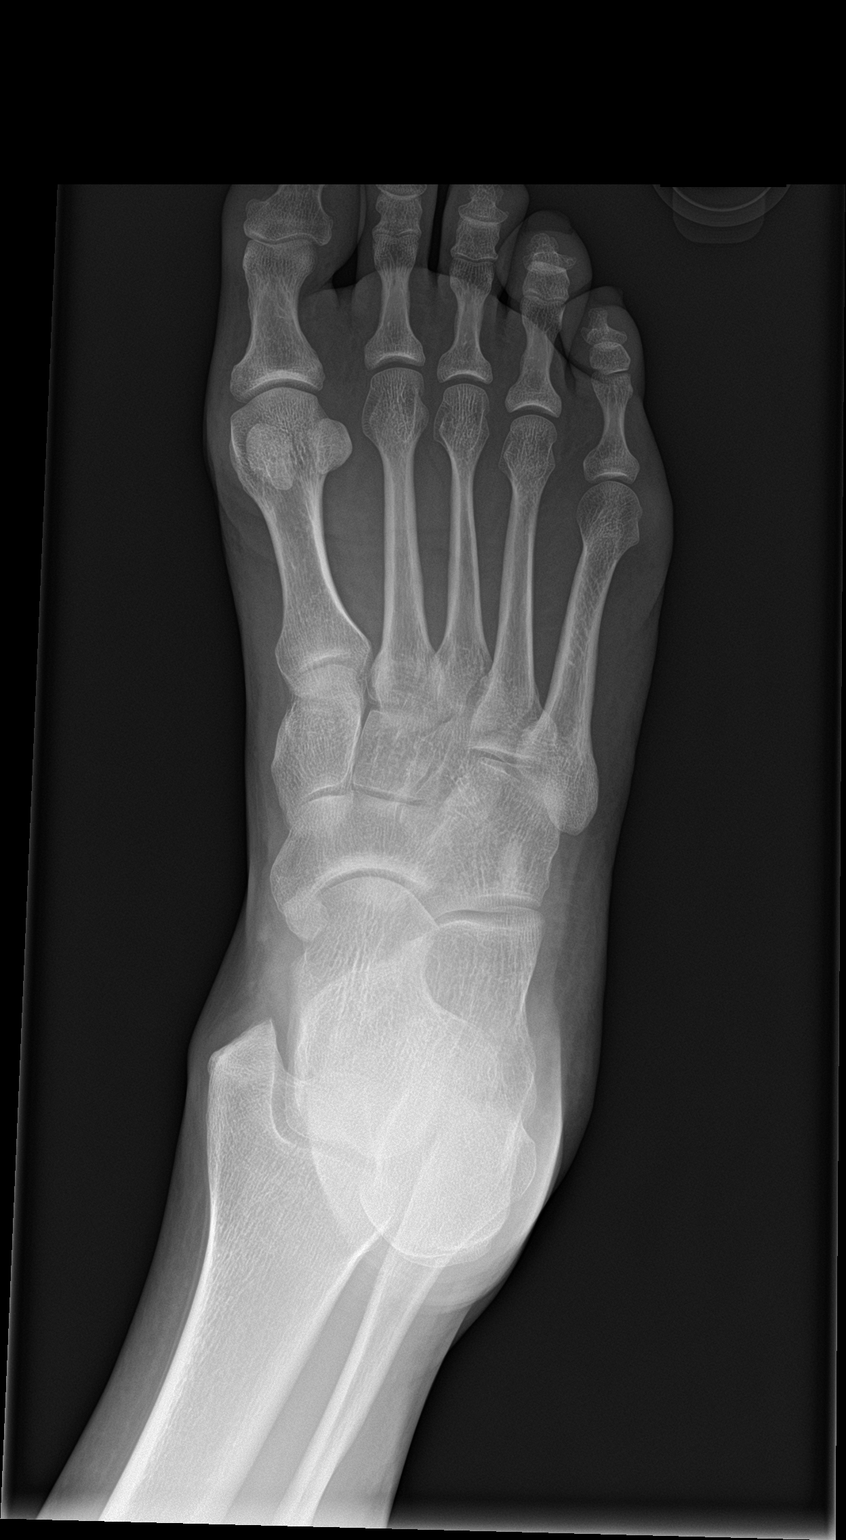

[foot obl]
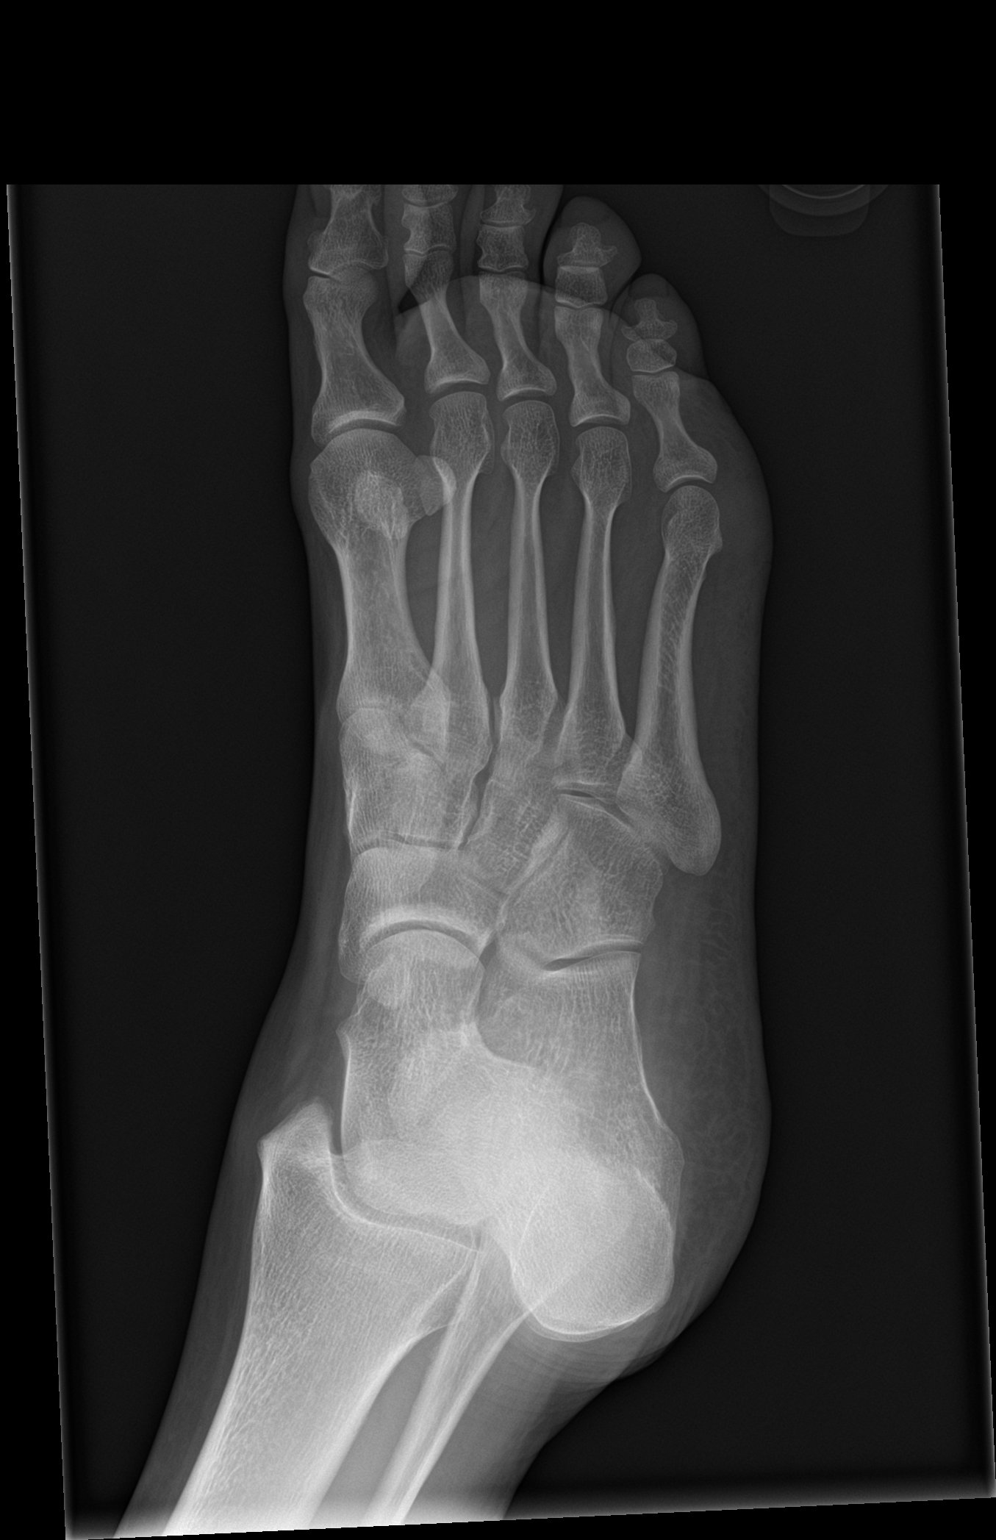

[foot lat]
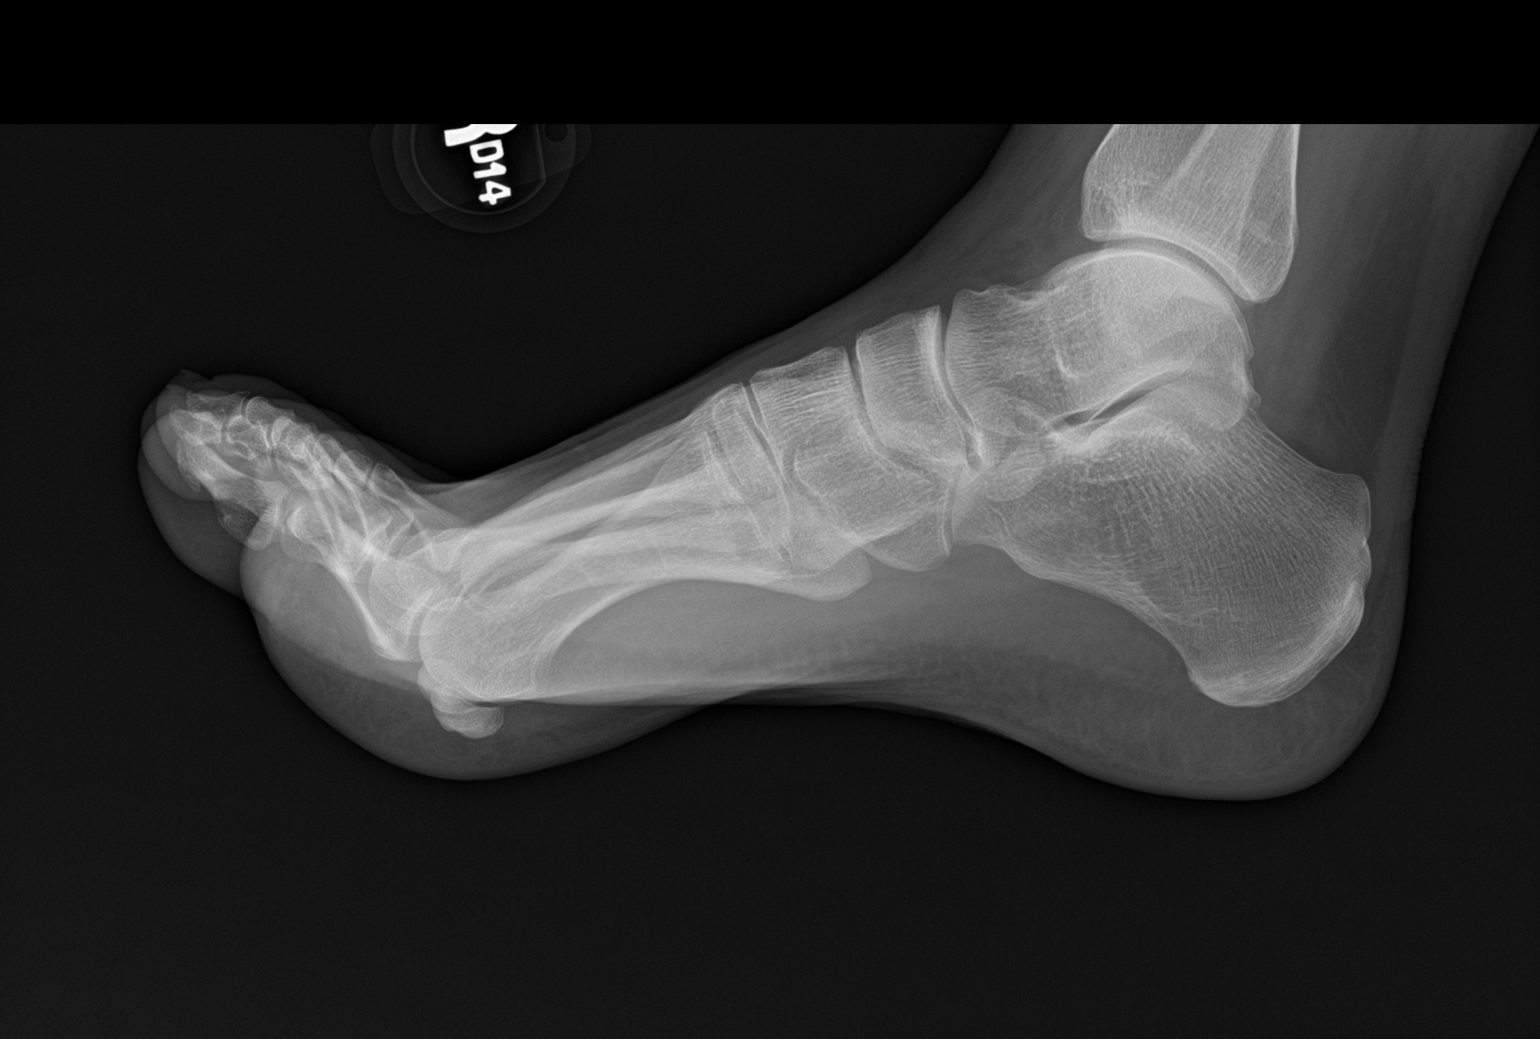

[3 of 3 positions shown; findings below may reference images not displayed]

FINDINGS: Osseous mineralization normal.

Joint spaces preserved.

No fracture, dislocation, or bone destruction.
IMPRESSION: Normal exam.
